# Patient Record
Sex: Male | Born: 2005 | Hispanic: No | Marital: Single | Smoking: Never smoker
Health system: Southern US, Community
[De-identification: ages and names within clinical notes are randomized; demographics above are authoritative.]

## PROBLEM LIST (undated history)

## (undated) DIAGNOSIS — F909 Attention-deficit hyperactivity disorder, unspecified type: Secondary | ICD-10-CM

## (undated) HISTORY — PX: CLOSED REDUCTION RADIAL / ULNAR SHAFT FRACTURE: SUR237

## (undated) HISTORY — DX: Attention-deficit hyperactivity disorder, unspecified type: F90.9

---
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
## 2023-08-26 ENCOUNTER — Encounter: Payer: Self-pay | Admitting: Family Medicine

## 2023-08-26 ENCOUNTER — Ambulatory Visit (INDEPENDENT_AMBULATORY_CARE_PROVIDER_SITE_OTHER): Payer: 59 | Admitting: Family Medicine

## 2023-08-26 VITALS — BP 124/78 | HR 84 | Temp 97.7°F | Ht 70.0 in | Wt 173.0 lb

## 2023-08-26 DIAGNOSIS — F321 Major depressive disorder, single episode, moderate: Secondary | ICD-10-CM

## 2023-08-26 MED ORDER — MELOXICAM 15 MG PO TABS: 15.0000 mg | ORAL_TABLET | Freq: Every day | ORAL | 1 refills | Status: DC

## 2023-08-26 MED ORDER — ESCITALOPRAM OXALATE 10 MG PO TABS: 10.0000 mg | ORAL_TABLET | Freq: Every day | ORAL | 1 refills | Status: DC

## 2023-08-26 NOTE — Progress Notes (Signed)
Subjective:    Patient ID: Peter Walsh, male    DOB: 05-12-2006, 18 y.o.   MRN: [id number]  Depression        Patient is here today accompanied by his mother.  He is battling severe depression.  He reports anhedonia.  He reports a sense of hopelessness and sadness.  He is tearful on today's exam.  He reports feeling anxious all the time.  He denies any suicidal ideation or suicidal plan.  He denies any hallucinations or delusions.  He denies any symptoms of mania.  He denies any panic attacks.  He has a strong family history for depression in both his mother and his maternal grandfather.  His maternal aunt may have bipolar.  He denies any substance abuse.  He does complain of chronic low back pain stemming from a T12 vertebral fracture he suffered several years ago. Past Medical History:  Diagnosis Date   ADHD (attention deficit hyperactivity disorder)    Asthma    Past Surgical History:  Procedure Laterality Date   CLOSED REDUCTION RADIAL / ULNAR SHAFT FRACTURE     No current outpatient medications on file prior to visit.   No current facility-administered medications on file prior to visit.   Allergies  Allergen Reactions   Amoxicillin Rash   Cefdinir Rash   Social History   Socioeconomic History   Marital status: Single    Spouse name: Not on file   Number of children: Not on file   Years of education: Not on file   Highest education level: Not on file  Occupational History   Not on file  Tobacco Use   Smoking status: Never   Smokeless tobacco: Never  Vaping Use   Vaping status: Never Used  Substance and Sexual Activity   Alcohol use: No   Drug use: No   Sexual activity: Not on file  Other Topics Concern   Not on file  Social History Narrative   4th grade at [employer].   Social Drivers of Corporate investment banker Strain: Not on file  Food Insecurity: Not on file  Transportation Needs: Not on file  Physical Activity: Not on file   Stress: Not on file  Social Connections: Not on file  Intimate Partner Violence: Not on file      Review of Systems  Psychiatric/Behavioral:  Positive for depression.        Objective:   Physical Exam Vitals reviewed.  Constitutional:      Appearance: Normal appearance. He is normal weight.  Cardiovascular:     Rate and Rhythm: Normal rate and regular rhythm.     Heart sounds: Normal heart sounds.  Pulmonary:     Effort: Pulmonary effort is normal.     Walsh sounds: Normal Walsh sounds.  Neurological:     Mental Status: He is alert.  Psychiatric:        Attention and Perception: Attention and perception normal.        Mood and Affect: Mood is depressed. Affect is tearful.        Speech: Speech normal.        Behavior: Behavior normal.        Cognition and Memory: Cognition and memory normal.           Assessment & Plan:   Current moderate episode of major depressive disorder without prior episode (HCC) My heart goes out for this patient.  We discussed a referral to psychology for counseling.  In the meantime begin Lexapro 10 mg a day and reassess in 3 to 4 weeks

## 2023-10-18 ENCOUNTER — Other Ambulatory Visit: Payer: Self-pay | Admitting: Family Medicine

## 2023-10-20 NOTE — Telephone Encounter (Signed)
 Requested medication (s) are due for refill today: yes  Requested medication (s) are on the active medication list: yes  Last refill:  08/26/23  Future visit scheduled: no  Notes to clinic:  Unable to refill per protocol due to failed labs, no updated results.      Requested Prescriptions  Pending Prescriptions Disp Refills   meloxicam (MOBIC) 15 MG tablet [Pharmacy Med Name: MELOXICAM 15 MG TABLET] 30 tablet 1    Sig: Take 1 tablet (15 mg total) by mouth daily.     Analgesics:  COX2 Inhibitors Failed - 10/20/2023 11:59 AM      Failed - Manual Review: Labs are only required if the patient has taken medication for more than 8 weeks.      Failed - HGB in normal range and within 360 days    No results found for: "HGB", "HGBKUC", "HGBPOCKUC", "HGBOTHER", "TOTHGB", "HGBPLASMA"       Failed - Cr in normal range and within 360 days    No results found for: "CREATININE", "LABCREAU", "LABCREA", "POCCRE"       Failed - HCT in normal range and within 360 days    No results found for: "HCT", "HCTKUC", "SRHCT"       Failed - AST in normal range and within 360 days    No results found for: "POCAST", "AST"       Failed - ALT in normal range and within 360 days    No results found for: "ALT", "LABALT", "POCALT"       Failed - eGFR is 30 or above and within 360 days    No results found for: "GFRAA", "GFRNONAA", "GFR", "EGFR"       Failed - Valid encounter within last 12 months    Recent Outpatient Visits           1 month ago Current moderate episode of major depressive disorder without prior episode [facility])   [place] [facility], Cisco Crest, MD   3 years ago Compression fracture of T12 vertebra with routine healing, subsequent encounter   [place] [facility] at [facility], Joselyn Nicely, MD   3 years ago Compression fracture of T12 vertebra with routine healing, subsequent encounter   [facility] at [facility], Joselyn Nicely, MD   3 years ago Closed fracture of coccyx, initial encounter [facility])   [facility] at [facility], Joselyn Nicely, MD              Passed - Patient is not pregnant

## 2023-10-30 ENCOUNTER — Other Ambulatory Visit: Payer: Self-pay | Admitting: Family Medicine

## 2023-10-30 NOTE — Telephone Encounter (Signed)
 Copied from CRM [id number]. Topic: Clinical - Medication Refill >> Oct 30, 2023 12:18 PM Lizabeth Riggs wrote: Most Recent Primary Care Visit:  Provider: Eliane Grooms T  Department: [facility]  Visit Type: PHYSICAL  Date: 08/26/2023  Medication:  meloxicam  (MOBIC ) 15 MG tablet  Has the patient contacted their pharmacy? Yes (Agent: If no, request that the patient contact the pharmacy for the refill. If patient does not wish to contact the pharmacy document the reason why and proceed with request.) (Agent: If yes, when and what did the pharmacy advise?) Pharmacy needs an order refill  Is this the correct pharmacy for this prescription? Yes If no, delete pharmacy and type the correct one.  This is the patient's preferred pharmacy:  [id number] - [place], [place] - [address] [place] [address] Phone: [phone] Fax: [phone]   Has the prescription been filled recently? No  Is the patient out of the medication? Yes - He has been out of medication for about 1 week.  Has the patient been seen for an appointment in the last year OR does the patient have an upcoming appointment? Yes  Can we respond through MyChart? Yes  Agent: Please be advised that Rx refills may take up to 3 business days. We ask that you follow-up with your pharmacy.

## 2023-10-31 NOTE — Telephone Encounter (Signed)
 Requested medication (s) are due for refill today: yes  Requested medication (s) are on the active medication list: yes  Last refill:  08/26/23  Future visit scheduled: no  Notes to clinic:  Unable to refill per protocol due to failed labs, no updated results. Last OV 08/26/23 within protocol.       Requested Prescriptions  Pending Prescriptions Disp Refills   meloxicam  (MOBIC ) 15 MG tablet 30 tablet 1    Sig: Take 1 tablet (15 mg total) by mouth daily.     Analgesics:  COX2 Inhibitors Failed - 10/31/2023 11:07 AM      Failed - Manual Review: Labs are only required if the patient has taken medication for more than 8 weeks.      Failed - HGB in normal range and within 360 days    No results found for: "HGB", "HGBKUC", "HGBPOCKUC", "HGBOTHER", "TOTHGB", "HGBPLASMA"       Failed - Cr in normal range and within 360 days    No results found for: "CREATININE", "LABCREAU", "LABCREA", "POCCRE"       Failed - HCT in normal range and within 360 days    No results found for: "HCT", "HCTKUC", "SRHCT"       Failed - AST in normal range and within 360 days    No results found for: "POCAST", "AST"       Failed - ALT in normal range and within 360 days    No results found for: "ALT", "LABALT", "POCALT"       Failed - eGFR is 30 or above and within 360 days    No results found for: "GFRAA", "GFRNONAA", "GFR", "EGFR"       Failed - Valid encounter within last 12 months    Recent Outpatient Visits           2 months ago Current moderate episode of major depressive disorder without prior episode [facility])   [place] [facility], Cisco Crest, MD   3 years ago Compression fracture of T12 vertebra with routine healing, subsequent encounter   [place] [facility] at [facility], Joselyn Nicely, MD   3 years ago Compression fracture of T12 vertebra with routine healing, subsequent encounter   [facility]  at [facility], Joselyn Nicely, MD   3 years ago Closed fracture of coccyx, initial encounter [facility])   [facility] at [facility], Joselyn Nicely, MD              Passed - Patient is not pregnant

## 2023-11-04 ENCOUNTER — Other Ambulatory Visit (HOSPITAL_BASED_OUTPATIENT_CLINIC_OR_DEPARTMENT_OTHER): Payer: Self-pay

## 2023-11-04 ENCOUNTER — Other Ambulatory Visit: Payer: Self-pay | Admitting: Family Medicine

## 2023-11-04 ENCOUNTER — Telehealth: Payer: Self-pay

## 2023-11-04 MED ORDER — MELOXICAM 15 MG PO TABS
15.0000 mg | ORAL_TABLET | Freq: Every day | ORAL | 3 refills | Status: DC
  Filled 2023-11-04: qty 30, 30d supply, fill #0

## 2023-11-04 NOTE — Telephone Encounter (Signed)
 Pt's mom called in to request a refill of this med for pt:  meloxicam  (MOBIC ) 15 MG tablet [[id number]]  Pt's mom stated that pt's pain is better controlled with this med and does not want pt to run out while he is away at college. Pt's mom asks if pt needs to come into office for labs or to see pcp, can pcp please send in a courtesy refill as pt graduates in 2 weeks and will be able to come in after that. Please advise.  LOV: 08/26/23 CPE  PHARMACY: [id number] [place], [place] - [address], [place] [address] Phone: [phone]  Fax: [phone]   CB#: [phone] Pt's mom Danette Duos

## 2023-11-05 ENCOUNTER — Other Ambulatory Visit (HOSPITAL_BASED_OUTPATIENT_CLINIC_OR_DEPARTMENT_OTHER): Payer: Self-pay

## 2024-01-31 ENCOUNTER — Other Ambulatory Visit: Payer: Self-pay | Admitting: Family Medicine

## 2024-02-04 ENCOUNTER — Other Ambulatory Visit: Payer: Self-pay | Admitting: Family Medicine

## 2024-03-02 ENCOUNTER — Telehealth: Payer: Self-pay | Admitting: Family Medicine

## 2024-03-02 NOTE — Telephone Encounter (Signed)
 Prescription Request  03/02/2024  LOV: 08/26/2023  What is the name of the medication or equipment?   meloxicam  (MOBIC ) 15 MG tablet   Have you contacted your pharmacy to request a refill? Yes   Which pharmacy would you like this sent to?  [id number] - [place], [place] - [address] [place] [address] Phone: [phone] Fax: [phone]    Patient notified that their request is being sent to the clinical staff for review and that they should receive a response within 2 business days.   Please advise pharmacist.

## 2024-03-12 ENCOUNTER — Ambulatory Visit: Admitting: Family Medicine

## 2024-03-12 ENCOUNTER — Encounter: Payer: Self-pay | Admitting: Family Medicine

## 2024-03-12 VITALS — BP 122/62 | HR 75 | Temp 98.3°F | Ht 70.0 in | Wt 177.6 lb

## 2024-03-12 DIAGNOSIS — F321 Major depressive disorder, single episode, moderate: Secondary | ICD-10-CM | POA: Diagnosis not present

## 2024-03-12 DIAGNOSIS — M545 Low back pain, unspecified: Secondary | ICD-10-CM

## 2024-03-12 DIAGNOSIS — G8929 Other chronic pain: Secondary | ICD-10-CM

## 2024-03-12 LAB — COMPREHENSIVE METABOLIC PANEL WITH GFR
AG Ratio: 2 (calc) (ref 1.0–2.5)
ALT: 12 U/L (ref 9–46)
AST: 20 U/L (ref 10–40)
Albumin: 5 g/dL (ref 3.6–5.1)
Alkaline phosphatase (APISO): 50 U/L (ref 36–130)
BUN: 15 mg/dL (ref 7–25)
CO2: 25 mmol/L (ref 20–32)
Calcium: 10 mg/dL (ref 8.6–10.3)
Chloride: 105 mmol/L (ref 98–110)
Creat: 0.82 mg/dL (ref 0.60–1.24)
Globulin: 2.5 g/dL (ref 1.9–3.7)
Glucose, Bld: 93 mg/dL (ref 65–99)
Potassium: 4.6 mmol/L (ref 3.5–5.3)
Sodium: 141 mmol/L (ref 135–146)
Total Bilirubin: 0.9 mg/dL (ref 0.2–1.2)
Total Protein: 7.5 g/dL (ref 6.1–8.1)
eGFR: 129 mL/min/1.73m2 (ref >=60)

## 2024-03-12 MED ORDER — ESCITALOPRAM OXALATE 10 MG PO TABS: 10.0000 mg | ORAL_TABLET | Freq: Every day | ORAL | 3 refills | Status: AC

## 2024-03-12 MED ORDER — MELOXICAM 15 MG PO TABS: 15.0000 mg | ORAL_TABLET | Freq: Every day | ORAL | 3 refills | Status: AC

## 2024-03-12 MED ORDER — BUPROPION HCL ER (XL) 150 MG PO TB24: 150.0000 mg | ORAL_TABLET | Freq: Every day | ORAL | 3 refills | Status: DC

## 2024-03-12 NOTE — Progress Notes (Signed)
 Subjective:    Patient ID: Peter Walsh, male    DOB: 14-Mar-2006, 18 y.o.   MRN: [id number]  Depression       08/26/23 Patient is here today accompanied by his mother.  He is battling severe depression.  He reports anhedonia.  He reports a sense of hopelessness and sadness.  He is tearful on today's exam.  He reports feeling anxious all the time.  He denies any suicidal ideation or suicidal plan.  He denies any hallucinations or delusions.  He denies any symptoms of mania.  He denies any panic attacks.  He has a strong family history for depression in both his mother and his maternal grandfather.  His maternal aunt may have bipolar.  He denies any substance abuse.  He does complain of chronic low back pain stemming from a T12 vertebral fracture he suffered several years ago.  At that time, my plan was: My heart goes out for this patient.  We discussed a referral to psychology for counseling.  In the meantime begin Lexapro  10 mg a day and reassess in 3 to 4 weeks   03/12/24 Patient's dates that he is doing better on the Lexapro .  He states that he does not feel it is down all the time.  He also states that he feels more optimistic and is able to recover more quickly when he feels sad however he still reports depression.  He still deals with anxiety.  He denies any suicidal thoughts.  Although he is doing better.  He is certainly not back to his baseline. Past Medical History:  Diagnosis Date   ADHD (attention deficit hyperactivity disorder)    Asthma    Past Surgical History:  Procedure Laterality Date   CLOSED REDUCTION RADIAL / ULNAR SHAFT FRACTURE     Current Outpatient Medications on File Prior to Visit  Medication Sig Dispense Refill   escitalopram  (LEXAPRO ) 10 MG tablet TAKE 1 TABLET BY MOUTH EVERY DAY 30 tablet 5   meloxicam  (MOBIC ) 15 MG tablet Take 1 tablet (15 mg total) by mouth daily. 30 tablet 3   No current facility-administered medications on file prior to visit.    Allergies  Allergen Reactions   Amoxicillin  Rash   Cefdinir Rash   Social History   Socioeconomic History   Marital status: Single    Spouse name: Not on file   Number of children: Not on file   Years of education: Not on file   Highest education level: Not on file  Occupational History   Not on file  Tobacco Use   Smoking status: Never   Smokeless tobacco: Never  Vaping Use   Vaping status: Never Used  Substance and Sexual Activity   Alcohol use: No   Drug use: No   Sexual activity: Not on file  Other Topics Concern   Not on file  Social History Narrative   4th grade at [employer].   Social Drivers of Corporate investment banker Strain: Not on file  Food Insecurity: Not on file  Transportation Needs: Not on file  Physical Activity: Not on file  Stress: Not on file  Social Connections: Not on file  Intimate Partner Violence: Not on file      Review of Systems     Objective:   Physical Exam Vitals reviewed.  Constitutional:      Appearance: Normal appearance. He is normal weight.  Cardiovascular:     Rate and Rhythm: Normal rate and regular  rhythm.     Heart sounds: Normal heart sounds.  Pulmonary:     Effort: Pulmonary effort is normal.     Breath sounds: Normal breath sounds.  Neurological:     Mental Status: He is alert.  Psychiatric:        Attention and Perception: Attention and perception normal.        Speech: Speech normal.        Behavior: Behavior normal.        Cognition and Memory: Cognition and memory normal.           Assessment & Plan:  Chronic midline low back pain, unspecified whether sciatica present - Plan: Comprehensive metabolic panel with GFR  Current moderate episode of major depressive disorder without prior episode (HCC) Patient has partially responded to Lexapro .  We discussed increasing Lexapro  versus augmenting with Wellbutrin .  The patient elects to augment with Wellbutrin  extended release 150 mg  daily and continue the Lexapro .  He is also using meloxicam  occasionally for low back pain.  Reassess in 6 weeks or sooner if worsening

## 2024-03-15 ENCOUNTER — Ambulatory Visit: Payer: Self-pay | Admitting: Family Medicine

## 2024-04-07 ENCOUNTER — Encounter: Payer: Self-pay | Admitting: Family Medicine

## 2024-04-08 ENCOUNTER — Other Ambulatory Visit: Payer: Self-pay | Admitting: Family Medicine

## 2024-04-08 MED ORDER — BUPROPION HCL ER (XL) 300 MG PO TB24: 300.0000 mg | ORAL_TABLET | Freq: Every day | ORAL | 5 refills | Status: AC
